# Patient Record
Sex: Female | Born: 1950 | Race: White | Hispanic: No | Marital: Married | Smoking: Never smoker
Health system: Southern US, Community
[De-identification: ages and names within clinical notes are randomized; demographics above are authoritative.]

---
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
## 1998-04-04 ENCOUNTER — Other Ambulatory Visit: Admission: RE | Admit: 1998-04-04 | Discharge: 1998-04-04 | Payer: Self-pay | Admitting: Family Medicine

## 1999-07-03 ENCOUNTER — Other Ambulatory Visit: Admission: RE | Admit: 1999-07-03 | Discharge: 1999-07-03 | Payer: Self-pay | Admitting: Family Medicine

## 2000-05-08 ENCOUNTER — Encounter: Payer: Self-pay | Admitting: Family Medicine

## 2000-05-08 ENCOUNTER — Encounter: Admission: RE | Admit: 2000-05-08 | Discharge: 2000-05-08 | Payer: Self-pay | Admitting: Family Medicine

## 2000-07-11 ENCOUNTER — Other Ambulatory Visit: Admission: RE | Admit: 2000-07-11 | Discharge: 2000-07-11 | Payer: Self-pay | Admitting: Family Medicine

## 2001-05-09 ENCOUNTER — Encounter: Payer: Self-pay | Admitting: Family Medicine

## 2001-05-09 ENCOUNTER — Encounter: Admission: RE | Admit: 2001-05-09 | Discharge: 2001-05-09 | Payer: Self-pay | Admitting: Family Medicine

## 2001-12-25 ENCOUNTER — Other Ambulatory Visit: Admission: RE | Admit: 2001-12-25 | Discharge: 2001-12-25 | Payer: Self-pay | Admitting: Family Medicine

## 2002-05-14 ENCOUNTER — Encounter: Payer: Self-pay | Admitting: Family Medicine

## 2002-05-14 ENCOUNTER — Encounter: Admission: RE | Admit: 2002-05-14 | Discharge: 2002-05-14 | Payer: Self-pay | Admitting: Family Medicine

## 2003-03-26 ENCOUNTER — Other Ambulatory Visit: Admission: RE | Admit: 2003-03-26 | Discharge: 2003-03-26 | Payer: Self-pay | Admitting: Family Medicine

## 2003-05-18 ENCOUNTER — Encounter: Admission: RE | Admit: 2003-05-18 | Discharge: 2003-05-18 | Payer: Self-pay | Admitting: Family Medicine

## 2003-05-18 ENCOUNTER — Encounter: Payer: Self-pay | Admitting: Family Medicine

## 2003-05-21 ENCOUNTER — Ambulatory Visit (HOSPITAL_COMMUNITY): Admission: RE | Admit: 2003-05-21 | Discharge: 2003-05-21 | Payer: Self-pay | Admitting: Gastroenterology

## 2004-05-19 ENCOUNTER — Ambulatory Visit (HOSPITAL_COMMUNITY): Admission: RE | Admit: 2004-05-19 | Discharge: 2004-05-19 | Payer: Self-pay | Admitting: Family Medicine

## 2004-05-26 ENCOUNTER — Other Ambulatory Visit: Admission: RE | Admit: 2004-05-26 | Discharge: 2004-05-26 | Payer: Self-pay | Admitting: Family Medicine

## 2005-06-01 ENCOUNTER — Other Ambulatory Visit: Admission: RE | Admit: 2005-06-01 | Discharge: 2005-06-01 | Payer: Self-pay | Admitting: Family Medicine

## 2005-06-18 ENCOUNTER — Ambulatory Visit (HOSPITAL_COMMUNITY): Admission: RE | Admit: 2005-06-18 | Discharge: 2005-06-18 | Payer: Self-pay | Admitting: Family Medicine

## 2006-04-17 ENCOUNTER — Encounter: Admission: RE | Admit: 2006-04-17 | Discharge: 2006-04-17 | Payer: Self-pay | Admitting: Family Medicine

## 2006-06-03 ENCOUNTER — Other Ambulatory Visit: Admission: RE | Admit: 2006-06-03 | Discharge: 2006-06-03 | Payer: Self-pay | Admitting: Family Medicine

## 2006-06-19 ENCOUNTER — Ambulatory Visit (HOSPITAL_COMMUNITY): Admission: RE | Admit: 2006-06-19 | Discharge: 2006-06-19 | Payer: Self-pay | Admitting: Family Medicine

## 2007-01-23 ENCOUNTER — Encounter: Admission: RE | Admit: 2007-01-23 | Discharge: 2007-01-23 | Payer: Self-pay | Admitting: Family Medicine

## 2007-06-18 ENCOUNTER — Other Ambulatory Visit: Admission: RE | Admit: 2007-06-18 | Discharge: 2007-06-18 | Payer: Self-pay | Admitting: Family Medicine

## 2007-06-23 ENCOUNTER — Encounter: Admission: RE | Admit: 2007-06-23 | Discharge: 2007-06-23 | Payer: Self-pay | Admitting: Family Medicine

## 2008-06-21 ENCOUNTER — Other Ambulatory Visit: Admission: RE | Admit: 2008-06-21 | Discharge: 2008-06-21 | Payer: Self-pay | Admitting: Family Medicine

## 2008-07-07 ENCOUNTER — Ambulatory Visit (HOSPITAL_COMMUNITY): Admission: RE | Admit: 2008-07-07 | Discharge: 2008-07-07 | Payer: Self-pay | Admitting: Family Medicine

## 2009-07-11 ENCOUNTER — Ambulatory Visit (HOSPITAL_COMMUNITY): Admission: RE | Admit: 2009-07-11 | Discharge: 2009-07-11 | Payer: Self-pay | Admitting: Family Medicine

## 2009-10-17 ENCOUNTER — Other Ambulatory Visit: Admission: RE | Admit: 2009-10-17 | Discharge: 2009-10-17 | Payer: Self-pay | Admitting: Family Medicine

## 2010-07-12 ENCOUNTER — Ambulatory Visit (HOSPITAL_COMMUNITY): Admission: RE | Admit: 2010-07-12 | Discharge: 2010-07-12 | Payer: Self-pay | Admitting: Family Medicine

## 2010-10-19 ENCOUNTER — Other Ambulatory Visit (HOSPITAL_COMMUNITY)
Admission: RE | Admit: 2010-10-19 | Discharge: 2010-10-19 | Disposition: A | Discharge status: Home / Self Care | Payer: 59 | Source: Ambulatory Visit | Attending: Family Medicine | Admitting: Family Medicine

## 2010-10-19 DIAGNOSIS — Z124 Encounter for screening for malignant neoplasm of cervix: Secondary | ICD-10-CM | POA: Insufficient documentation

## 2011-01-19 NOTE — Op Note (Signed)
   NAME:  Frances Crosby, Frances Crosby                          ACCOUNT NO.:  [id number]   MEDICAL RECORD NO.:  [id number]                   PATIENT TYPE:  AMB   LOCATION:  ENDO                                 FACILITY:  [facility]   PHYSICIAN:  Graylin Shiver, M.D.                DATE OF BIRTH:  1951-04-26   DATE OF PROCEDURE:  05/21/2003  DATE OF DISCHARGE:  05/21/2003                                 OPERATIVE REPORT   PROCEDURE:  Colonoscopy.   ENDOSCOPIST:  Graylin Shiver, M.D.   INDICATIONS:  Screening due to family history of colon cancer and colon  polyps.   INFORMED CONSENT:  Informed consent was obtained after explanation of the  risks of bleeding, infection and perforation.   PREMEDICATION:  Fentanyl 75 mcg IV, Versed 7 mg IV.   DESCRIPTION OF PROCEDURE:  With the patient in the left lateral decubitus  position, a rectal exam was performed and no masses were felt.  The Olympus  colonoscope was inserted into the rectum and advanced around the colon to  the cecum.  The cecal landmarks were identified.  The cecum and ascending  colon were normal.  The transverse colon was normal.  The descending colon  was normal.  A few diverticula were noted in the sigmoid colon.  The rectum  was normal.  She tolerated the procedure well without complications.   IMPRESSION:  Few diverticula noted in the sigmoid, otherwise normal  colonoscopy to the cecum.                                               Graylin Shiver, M.D.    SFG/MEDQ  D:  05/21/2003  T:  05/24/2003  Job:  [id number]   cc:   Duncan Dull, M.D.  [address]  [place]  [place] [address]  Fax: [phone]

## 2011-06-15 ENCOUNTER — Other Ambulatory Visit (HOSPITAL_COMMUNITY): Payer: Self-pay | Admitting: Family Medicine

## 2011-06-15 DIAGNOSIS — Z1231 Encounter for screening mammogram for malignant neoplasm of breast: Secondary | ICD-10-CM

## 2011-07-17 ENCOUNTER — Ambulatory Visit (HOSPITAL_COMMUNITY)
Admission: RE | Admit: 2011-07-17 | Discharge: 2011-07-17 | Disposition: A | Discharge status: Home / Self Care | Payer: 59 | Source: Ambulatory Visit | Attending: Family Medicine | Admitting: Family Medicine

## 2011-07-17 DIAGNOSIS — Z1231 Encounter for screening mammogram for malignant neoplasm of breast: Secondary | ICD-10-CM | POA: Insufficient documentation

## 2012-06-09 ENCOUNTER — Other Ambulatory Visit (HOSPITAL_COMMUNITY): Payer: Self-pay | Admitting: Family Medicine

## 2012-06-09 DIAGNOSIS — Z1231 Encounter for screening mammogram for malignant neoplasm of breast: Secondary | ICD-10-CM

## 2012-07-17 ENCOUNTER — Ambulatory Visit (HOSPITAL_COMMUNITY)
Admission: RE | Admit: 2012-07-17 | Discharge: 2012-07-17 | Disposition: A | Discharge status: Home / Self Care | Payer: 59 | Source: Ambulatory Visit | Attending: Family Medicine | Admitting: Family Medicine

## 2012-07-17 DIAGNOSIS — Z1231 Encounter for screening mammogram for malignant neoplasm of breast: Secondary | ICD-10-CM

## 2013-06-25 ENCOUNTER — Other Ambulatory Visit (HOSPITAL_COMMUNITY): Payer: Self-pay | Admitting: Family Medicine

## 2013-06-25 DIAGNOSIS — Z1231 Encounter for screening mammogram for malignant neoplasm of breast: Secondary | ICD-10-CM

## 2013-07-22 ENCOUNTER — Ambulatory Visit (HOSPITAL_COMMUNITY)
Admission: RE | Admit: 2013-07-22 | Discharge: 2013-07-22 | Disposition: A | Discharge status: Home / Self Care | Payer: 59 | Source: Ambulatory Visit | Attending: Family Medicine | Admitting: Family Medicine

## 2013-07-22 DIAGNOSIS — Z1231 Encounter for screening mammogram for malignant neoplasm of breast: Secondary | ICD-10-CM | POA: Insufficient documentation

## 2013-11-04 ENCOUNTER — Other Ambulatory Visit (HOSPITAL_COMMUNITY)
Admission: RE | Admit: 2013-11-04 | Discharge: 2013-11-04 | Disposition: A | Discharge status: Home / Self Care | Payer: 59 | Source: Ambulatory Visit | Attending: Family Medicine | Admitting: Family Medicine

## 2013-11-04 ENCOUNTER — Other Ambulatory Visit: Payer: Self-pay | Admitting: Family Medicine

## 2013-11-04 DIAGNOSIS — Z124 Encounter for screening for malignant neoplasm of cervix: Secondary | ICD-10-CM | POA: Insufficient documentation

## 2014-06-29 ENCOUNTER — Other Ambulatory Visit (HOSPITAL_COMMUNITY): Payer: Self-pay | Admitting: Family Medicine

## 2014-06-29 DIAGNOSIS — Z1231 Encounter for screening mammogram for malignant neoplasm of breast: Secondary | ICD-10-CM

## 2014-07-23 ENCOUNTER — Ambulatory Visit (HOSPITAL_COMMUNITY)
Admission: RE | Admit: 2014-07-23 | Discharge: 2014-07-23 | Disposition: A | Discharge status: Home / Self Care | Payer: 59 | Source: Ambulatory Visit | Attending: Family Medicine | Admitting: Family Medicine

## 2014-07-23 DIAGNOSIS — Z1231 Encounter for screening mammogram for malignant neoplasm of breast: Secondary | ICD-10-CM | POA: Insufficient documentation

## 2015-06-20 ENCOUNTER — Other Ambulatory Visit: Payer: Self-pay

## 2015-06-20 DIAGNOSIS — Z1231 Encounter for screening mammogram for malignant neoplasm of breast: Secondary | ICD-10-CM

## 2015-08-02 ENCOUNTER — Ambulatory Visit
Admission: RE | Admit: 2015-08-02 | Discharge: 2015-08-02 | Disposition: A | Discharge status: Home / Self Care | Payer: 59 | Source: Ambulatory Visit

## 2015-08-02 DIAGNOSIS — Z1231 Encounter for screening mammogram for malignant neoplasm of breast: Secondary | ICD-10-CM

## 2015-08-04 ENCOUNTER — Other Ambulatory Visit: Payer: Self-pay | Admitting: Family Medicine

## 2015-08-04 DIAGNOSIS — R928 Other abnormal and inconclusive findings on diagnostic imaging of breast: Secondary | ICD-10-CM

## 2015-08-10 ENCOUNTER — Ambulatory Visit
Admission: RE | Admit: 2015-08-10 | Discharge: 2015-08-10 | Disposition: A | Discharge status: Home / Self Care | Payer: 59 | Source: Ambulatory Visit | Attending: Family Medicine | Admitting: Family Medicine

## 2015-08-10 DIAGNOSIS — R928 Other abnormal and inconclusive findings on diagnostic imaging of breast: Secondary | ICD-10-CM

## 2016-01-02 ENCOUNTER — Other Ambulatory Visit: Payer: Self-pay | Admitting: Family Medicine

## 2016-01-02 DIAGNOSIS — N63 Unspecified lump in unspecified breast: Secondary | ICD-10-CM

## 2016-02-09 ENCOUNTER — Ambulatory Visit
Admission: RE | Admit: 2016-02-09 | Discharge: 2016-02-09 | Disposition: A | Discharge status: Home / Self Care | Payer: 59 | Source: Ambulatory Visit | Attending: Family Medicine | Admitting: Family Medicine

## 2016-02-09 DIAGNOSIS — N63 Unspecified lump in unspecified breast: Secondary | ICD-10-CM

## 2016-06-18 ENCOUNTER — Other Ambulatory Visit: Payer: Self-pay | Admitting: Family Medicine

## 2016-06-18 DIAGNOSIS — N6320 Unspecified lump in the left breast, unspecified quadrant: Secondary | ICD-10-CM

## 2016-08-02 ENCOUNTER — Ambulatory Visit
Admission: RE | Admit: 2016-08-02 | Discharge: 2016-08-02 | Disposition: A | Discharge status: Home / Self Care | Payer: 59 | Source: Ambulatory Visit | Attending: Family Medicine | Admitting: Family Medicine

## 2016-08-02 DIAGNOSIS — N6320 Unspecified lump in the left breast, unspecified quadrant: Secondary | ICD-10-CM

## 2016-12-10 ENCOUNTER — Other Ambulatory Visit (HOSPITAL_COMMUNITY)
Admission: RE | Admit: 2016-12-10 | Discharge: 2016-12-10 | Disposition: A | Discharge status: Home / Self Care | Payer: 59 | Source: Ambulatory Visit | Attending: Family Medicine | Admitting: Family Medicine

## 2016-12-10 ENCOUNTER — Other Ambulatory Visit: Payer: Self-pay | Admitting: Family Medicine

## 2016-12-10 DIAGNOSIS — Z01411 Encounter for gynecological examination (general) (routine) with abnormal findings: Secondary | ICD-10-CM | POA: Insufficient documentation

## 2016-12-12 LAB — CYTOLOGY - PAP: DIAGNOSIS: NEGATIVE

## 2017-06-21 ENCOUNTER — Other Ambulatory Visit: Payer: Self-pay | Admitting: Family Medicine

## 2017-06-21 DIAGNOSIS — N6320 Unspecified lump in the left breast, unspecified quadrant: Secondary | ICD-10-CM

## 2017-08-05 ENCOUNTER — Other Ambulatory Visit: Payer: Self-pay | Admitting: Family Medicine

## 2017-08-05 ENCOUNTER — Ambulatory Visit
Admission: RE | Admit: 2017-08-05 | Discharge: 2017-08-05 | Disposition: A | Discharge status: Home / Self Care | Payer: Medicare Other | Source: Ambulatory Visit | Attending: Family Medicine | Admitting: Family Medicine

## 2017-08-05 ENCOUNTER — Ambulatory Visit
Admission: RE | Admit: 2017-08-05 | Discharge: 2017-08-05 | Disposition: A | Discharge status: Home / Self Care | Payer: 59 | Source: Ambulatory Visit | Attending: Family Medicine | Admitting: Family Medicine

## 2017-08-05 DIAGNOSIS — N6320 Unspecified lump in the left breast, unspecified quadrant: Secondary | ICD-10-CM

## 2017-08-07 ENCOUNTER — Ambulatory Visit
Admission: RE | Admit: 2017-08-07 | Discharge: 2017-08-07 | Disposition: A | Discharge status: Home / Self Care | Payer: Medicare Other | Source: Ambulatory Visit | Attending: Family Medicine | Admitting: Family Medicine

## 2017-08-07 ENCOUNTER — Other Ambulatory Visit: Payer: Self-pay | Admitting: Family Medicine

## 2017-08-07 DIAGNOSIS — N6320 Unspecified lump in the left breast, unspecified quadrant: Secondary | ICD-10-CM

## 2018-07-07 ENCOUNTER — Other Ambulatory Visit: Payer: Self-pay | Admitting: Family Medicine

## 2018-07-07 DIAGNOSIS — Z1231 Encounter for screening mammogram for malignant neoplasm of breast: Secondary | ICD-10-CM

## 2018-08-14 ENCOUNTER — Ambulatory Visit
Admission: RE | Admit: 2018-08-14 | Discharge: 2018-08-14 | Disposition: A | Discharge status: Home / Self Care | Payer: Medicare Other | Source: Ambulatory Visit | Attending: Family Medicine | Admitting: Family Medicine

## 2018-08-14 DIAGNOSIS — Z1231 Encounter for screening mammogram for malignant neoplasm of breast: Secondary | ICD-10-CM

## 2019-07-01 ENCOUNTER — Other Ambulatory Visit: Payer: Self-pay | Admitting: Family Medicine

## 2019-07-01 DIAGNOSIS — Z1231 Encounter for screening mammogram for malignant neoplasm of breast: Secondary | ICD-10-CM

## 2019-08-20 ENCOUNTER — Other Ambulatory Visit: Payer: Self-pay

## 2019-08-20 ENCOUNTER — Ambulatory Visit
Admission: RE | Admit: 2019-08-20 | Discharge: 2019-08-20 | Disposition: A | Discharge status: Home / Self Care | Payer: Medicare Other | Source: Ambulatory Visit | Attending: Family Medicine | Admitting: Family Medicine

## 2019-08-20 DIAGNOSIS — Z1231 Encounter for screening mammogram for malignant neoplasm of breast: Secondary | ICD-10-CM

## 2019-10-11 ENCOUNTER — Ambulatory Visit: Payer: Self-pay

## 2019-10-29 ENCOUNTER — Ambulatory Visit: Payer: Medicare Other

## 2019-12-28 ENCOUNTER — Other Ambulatory Visit: Payer: Self-pay | Admitting: Family Medicine

## 2019-12-28 DIAGNOSIS — E2839 Other primary ovarian failure: Secondary | ICD-10-CM

## 2020-03-18 ENCOUNTER — Inpatient Hospital Stay: Admission: RE | Admit: 2020-03-18 | Payer: Medicare Other | Source: Ambulatory Visit

## 2020-03-23 ENCOUNTER — Ambulatory Visit
Admission: RE | Admit: 2020-03-23 | Discharge: 2020-03-23 | Disposition: A | Discharge status: Home / Self Care | Payer: Medicare Other | Source: Ambulatory Visit | Attending: Family Medicine | Admitting: Family Medicine

## 2020-03-23 ENCOUNTER — Other Ambulatory Visit: Payer: Self-pay

## 2020-03-23 DIAGNOSIS — E2839 Other primary ovarian failure: Secondary | ICD-10-CM

## 2020-07-06 ENCOUNTER — Other Ambulatory Visit: Payer: Self-pay | Admitting: Family Medicine

## 2020-07-06 DIAGNOSIS — Z1231 Encounter for screening mammogram for malignant neoplasm of breast: Secondary | ICD-10-CM

## 2020-08-22 ENCOUNTER — Other Ambulatory Visit: Payer: Self-pay

## 2020-08-22 ENCOUNTER — Ambulatory Visit
Admission: RE | Admit: 2020-08-22 | Discharge: 2020-08-22 | Disposition: A | Discharge status: Home / Self Care | Payer: Medicare Other | Source: Ambulatory Visit | Attending: Family Medicine | Admitting: Family Medicine

## 2020-08-22 DIAGNOSIS — Z1231 Encounter for screening mammogram for malignant neoplasm of breast: Secondary | ICD-10-CM

## 2021-01-16 ENCOUNTER — Other Ambulatory Visit (HOSPITAL_COMMUNITY): Payer: Self-pay | Admitting: Family Medicine

## 2021-01-17 ENCOUNTER — Other Ambulatory Visit: Payer: Self-pay

## 2021-01-17 ENCOUNTER — Ambulatory Visit (HOSPITAL_BASED_OUTPATIENT_CLINIC_OR_DEPARTMENT_OTHER)
Admission: RE | Admit: 2021-01-17 | Discharge: 2021-01-17 | Disposition: A | Discharge status: Home / Self Care | Payer: Self-pay | Source: Ambulatory Visit | Attending: Family Medicine | Admitting: Family Medicine

## 2021-01-17 DIAGNOSIS — E7800 Pure hypercholesterolemia, unspecified: Secondary | ICD-10-CM | POA: Insufficient documentation

## 2021-02-07 ENCOUNTER — Other Ambulatory Visit: Payer: Self-pay

## 2021-02-07 DIAGNOSIS — R931 Abnormal findings on diagnostic imaging of heart and coronary circulation: Secondary | ICD-10-CM

## 2021-02-07 DIAGNOSIS — E559 Vitamin D deficiency, unspecified: Secondary | ICD-10-CM

## 2021-02-07 DIAGNOSIS — I2510 Atherosclerotic heart disease of native coronary artery without angina pectoris: Secondary | ICD-10-CM

## 2021-02-07 DIAGNOSIS — I10 Essential (primary) hypertension: Secondary | ICD-10-CM | POA: Insufficient documentation

## 2021-02-07 DIAGNOSIS — J301 Allergic rhinitis due to pollen: Secondary | ICD-10-CM | POA: Insufficient documentation

## 2021-02-07 DIAGNOSIS — Z86010 Personal history of colonic polyps: Secondary | ICD-10-CM

## 2021-02-07 DIAGNOSIS — E2839 Other primary ovarian failure: Secondary | ICD-10-CM | POA: Insufficient documentation

## 2021-02-07 DIAGNOSIS — E7800 Pure hypercholesterolemia, unspecified: Secondary | ICD-10-CM

## 2021-02-07 DIAGNOSIS — I2584 Coronary atherosclerosis due to calcified coronary lesion: Secondary | ICD-10-CM | POA: Insufficient documentation

## 2021-02-07 DIAGNOSIS — Z860101 Personal history of adenomatous and serrated colon polyps: Secondary | ICD-10-CM

## 2021-02-07 HISTORY — DX: Personal history of colonic polyps: Z86.010

## 2021-02-07 HISTORY — DX: Vitamin D deficiency, unspecified: E55.9

## 2021-02-07 HISTORY — DX: Personal history of adenomatous and serrated colon polyps: Z86.0101

## 2021-02-07 HISTORY — DX: Pure hypercholesterolemia, unspecified: E78.00

## 2021-02-07 HISTORY — DX: Allergic rhinitis due to pollen: J30.1

## 2021-02-07 HISTORY — DX: Atherosclerotic heart disease of native coronary artery without angina pectoris: I25.10

## 2021-02-07 HISTORY — DX: Essential (primary) hypertension: I10

## 2021-02-07 HISTORY — DX: Abnormal findings on diagnostic imaging of heart and coronary circulation: R93.1

## 2021-02-14 ENCOUNTER — Ambulatory Visit: Payer: Medicare Other | Admitting: Cardiology

## 2021-02-14 ENCOUNTER — Other Ambulatory Visit: Payer: Self-pay

## 2021-02-14 ENCOUNTER — Encounter: Payer: Self-pay | Admitting: Cardiology

## 2021-02-14 ENCOUNTER — Other Ambulatory Visit: Payer: Self-pay | Admitting: Cardiology

## 2021-02-14 VITALS — BP 110/60 | HR 58 | Ht 66.0 in | Wt 155.0 lb

## 2021-02-14 DIAGNOSIS — E7800 Pure hypercholesterolemia, unspecified: Secondary | ICD-10-CM | POA: Diagnosis not present

## 2021-02-14 DIAGNOSIS — I10 Essential (primary) hypertension: Secondary | ICD-10-CM | POA: Diagnosis not present

## 2021-02-14 DIAGNOSIS — I2510 Atherosclerotic heart disease of native coronary artery without angina pectoris: Secondary | ICD-10-CM | POA: Diagnosis not present

## 2021-02-14 NOTE — Patient Instructions (Signed)

## 2021-02-14 NOTE — Progress Notes (Signed)
Cardiology Consultation:    Date:  02/14/2021   ID:  Frances Crosby, DOB 03-25-1951, MRN [id number]  PCP:  Frances Hale, MD  Cardiologist:  Frances Balsam, MD   Referring MD: Frances [place], MD   No chief complaint on file. I have abnormal CT of my hear  History of Present Illness:    Frances Crosby is a 70 y.o. female who is being seen today for the evaluation of elevated calcium score at the request of Frances [place], MD. past medical history significant for essential hypertension, she is being on phentermine as well as hydrochlorothiazide for years, also dyslipidemia.  Recently she had annual physical done by her primary care physician decision has been made to pursue coronary CT calcium score which was done, total #217 which put her at 47 percentile for age and race and sex matched controls.  She is here to talk about it. Overall she is very active she walks almost every single day for long time with her friend and she got no difficulty doing it independent functions been doing this for more than 10 years she did not notice any deterioration in her ability to do that denies having any chest pain tightness squeezing pressure burning chest.  She does get short of breath while walking uphill but this is nothing new and actually she tells me this is getting better.  There is no cardiac complaints whatsoever.  There is no palpitations no dizziness no swelling of lower extremities overall she is doing very well.  She is also very health-conscious and diet conscious.  She is trying to avoid red meat and we did actually discuss basic of Mediterranean diet which she was already very much aware of.  She never smoked.  She did have a husband who got significant heart problem and Dr. Hurman Horn was taking care of him.  And sadly he passed few months ago.  She is planning to go to a trip to [place] actually she is having car of an trip with multiple RVs going there.  She is looking forward  to it.  Since she was diagnosed with having elevated calcium score and coronary arteries she started taking rosuvastatin so far doing quite well with this described to have some cramps. She does have family history of premature coronary artery disease  Past Medical History:  Diagnosis Date   Abnormal findings on diagnostic imaging of heart and coronary circulation 02/07/2021   Allergic rhinitis due to pollen 02/07/2021   Atherosclerosis of coronary artery 02/07/2021   Essential hypertension 02/07/2021   History of adenomatous polyp of colon 02/07/2021   Pure hypercholesterolemia 02/07/2021   Vitamin D deficiency 02/07/2021    Past Surgical History:  Procedure Laterality Date   CYSTECTOMY  1971    Current Medications: Current Meds  Medication Sig   atenolol (TENORMIN) 25 MG tablet Take 1 tablet by mouth 2 (two) times daily.   Calcium Citrate-Vitamin D3 315-250 MG-UNIT TABS Take 1 tablet by mouth daily.   cholecalciferol (VITAMIN D3) 25 MCG (1000 UNIT) tablet Take 1,000 Units by mouth daily.   fluticasone (FLONASE) 50 MCG/ACT nasal spray Place 1 spray into both nostrils daily.   hydrochlorothiazide (MICROZIDE) 12.5 MG capsule Take 1 tablet by mouth daily.   loratadine (CLARITIN) 10 MG tablet Take 1 tablet by mouth as needed for allergies.   meclizine (ANTIVERT) 25 MG tablet Take 25 mg by mouth as needed for dizziness.   melatonin 3 MG TABS tablet Take 1  tablet by mouth daily.   Misc Natural Products (GLUCOS-CHONDROIT-MSM COMPLEX) TABS Take 2 tablets by mouth daily.   Multiple Vitamin (MULTI-VITAMIN DAILY PO) Take 1 tablet by mouth daily.   naproxen sodium (ALEVE) 220 MG tablet Take 1 tablet by mouth as needed (pain).   rosuvastatin (CRESTOR) 10 MG tablet Take 1 tablet by mouth daily.     Allergies:   Patient has no known allergies.   Social History   Socioeconomic History   Marital status: Married    Spouse name: Not on file   Number of children: Not on file   Years of education: Not on  file   Highest education level: Not on file  Occupational History   Not on file  Tobacco Use   Smoking status: Never   Smokeless tobacco: Never  Substance and Sexual Activity   Alcohol use: Not on file    Comment: 1-3 drinks per month   Drug use: Never   Sexual activity: Not on file  Other Topics Concern   Not on file  Social History Narrative   Not on file   Social Determinants of Health   Financial Resource Strain: Not on file  Food Insecurity: Not on file  Transportation Needs: Not on file  Physical Activity: Not on file  Stress: Not on file  Social Connections: Not on file     Family History: The patient's family history includes Atrial fibrillation in her brother and father; Cancer - Colon in her paternal grandfather; Diabetes in her father; Kidney cancer in her father; Parkinson's disease in her mother; Vascular Disease in her maternal grandfather. ROS:   Please see the history of present illness.    All 14 point review of systems negative except as described per history of present illness.  EKGs/Labs/Other Studies Reviewed:    The following studies were reviewed today: Coronary calcium score 217 which is a 86 percentile of ag race and sex matched controls.  EKG:  EKG is  ordered today.  The ekg ordered today demonstrates EKG showed sinus bradycardia, low voltage QRS, nonspecific ST segment changes  Recent Labs: No results found for requested labs within last 8760 hours.  Recent Lipid Panel No results found for: CHOL, TRIG, HDL, CHOLHDL, VLDL, LDLCALC, LDLDIRECT  Physical Exam:    VS:  BP 110/60 (BP Location: Left Arm, Patient Position: Sitting, Cuff Size: Normal)   Pulse (!) 58   Ht 5\' 6"  (1.676 m)   Wt 155 lb (70.3 kg)   LMP 07/17/2011   SpO2 99%   BMI 25.02 kg/m     Wt Readings from Last 3 Encounters:  02/14/21 155 lb (70.3 kg)     GEN:  Well nourished, well developed in no acute distress HEENT: Normal NECK: No JVD; No carotid bruits LYMPHATICS:  No lymphadenopathy CARDIAC: RRR, no murmurs, no rubs, no gallops RESPIRATORY:  Clear to auscultation without rales, wheezing or rhonchi  ABDOMEN: Soft, non-tender, non-distended MUSCULOSKELETAL:  No edema; No deformity  SKIN: Warm and dry NEUROLOGIC:  Alert and oriented x 3 PSYCHIATRIC:  Normal affect   ASSESSMENT:    1. Atherosclerosis of coronary artery, unspecified vessel or lesion type, unspecified whether angina present, unspecified whether native or transplanted heart   2. Essential hypertension   3. Pure hypercholesterolemia    PLAN:    In order of problems listed above:  Atherosclerosis of coronary arteries based on her calcium score which is 217.  With a long discussion I explained to her what that means.  Likely she is absolutely completely asymptomatic therefore I do not think we need to do any testing trying to see if she got obstructive disease.  Yes of course we need to modify risk factors for coronary artery disease and she is already initiated statin which is very appropriate move.  We discussed the need to exercise on the regular basis which she already does, we discussed basic of Mediterranean diet which she was aware of and I strongly encouraged her to pursue that.  We did did talk about potentially having echocardiogram done to assess left ventricle ejection fraction, however she is completely asymptomatic doing exercises on the regular basis I do not have any heart push for doing this test right now.  On top of that she is going on the trip to [place] for next few months.  What we decided to do is to perform direct LDL today to see if rosuvastatin 10 mg is sufficient. Essential hypertension blood pressure seems to be good control today.  We will continue present management.  In the future we may switch her from atenolol to some different medication. Dyslipidemia she started rosuvastatin and plan is as described above.  Overall after admit she is in the very good shape  taking care of herself well there is not much I had to add to what she is doing already.  I need to see her back in about a year to see how she does   Medication Adjustments/Labs and Tests Ordered: Current medicines are reviewed at length with the patient today.  Concerns regarding medicines are outlined above.  Orders Placed This Encounter  Procedures   Direct LDL   EKG 12-Lead   No orders of the defined types were placed in this encounter.   Signed, Georgeanna Lea, MD, [facility]. 02/14/2021 12:02 PM    [facility]

## 2021-02-15 LAB — LDL CHOLESTEROL, DIRECT: LDL Direct: 66 mg/dL (ref 0–99)

## 2021-02-20 ENCOUNTER — Telehealth: Payer: Self-pay | Admitting: Cardiology

## 2021-02-20 NOTE — Telephone Encounter (Signed)
Patient informed of results.  

## 2021-02-20 NOTE — Telephone Encounter (Signed)
Follow up:     Patient returning nurse call back for results.

## 2021-05-09 ENCOUNTER — Ambulatory Visit: Payer: Medicare Other | Admitting: Physician Assistant

## 2021-07-10 ENCOUNTER — Other Ambulatory Visit: Payer: Self-pay | Admitting: Family Medicine

## 2021-07-10 DIAGNOSIS — Z1231 Encounter for screening mammogram for malignant neoplasm of breast: Secondary | ICD-10-CM

## 2021-08-22 ENCOUNTER — Other Ambulatory Visit: Payer: Self-pay | Admitting: Family Medicine

## 2021-08-22 ENCOUNTER — Ambulatory Visit
Admission: RE | Admit: 2021-08-22 | Discharge: 2021-08-22 | Disposition: A | Discharge status: Home / Self Care | Payer: Medicare Other | Source: Ambulatory Visit | Attending: Family Medicine | Admitting: Family Medicine

## 2021-08-22 DIAGNOSIS — Z1231 Encounter for screening mammogram for malignant neoplasm of breast: Secondary | ICD-10-CM

## 2021-08-23 ENCOUNTER — Ambulatory Visit
Admission: RE | Admit: 2021-08-23 | Discharge: 2021-08-23 | Disposition: A | Discharge status: Home / Self Care | Payer: Medicare Other | Source: Ambulatory Visit | Attending: Family Medicine | Admitting: Family Medicine

## 2021-12-04 ENCOUNTER — Telehealth: Payer: Self-pay | Admitting: Cardiology

## 2021-12-04 NOTE — Telephone Encounter (Signed)
Patient would like to switch to a provider in our [place] location. Patient would like to see Dr. Anne Fu. Please advise ?

## 2022-01-18 ENCOUNTER — Other Ambulatory Visit: Payer: Self-pay | Admitting: Gastroenterology

## 2022-01-18 ENCOUNTER — Ambulatory Visit
Admission: RE | Admit: 2022-01-18 | Discharge: 2022-01-18 | Disposition: A | Discharge status: Home / Self Care | Payer: Medicare Other | Source: Ambulatory Visit | Attending: Gastroenterology | Admitting: Gastroenterology

## 2022-01-18 DIAGNOSIS — R195 Other fecal abnormalities: Secondary | ICD-10-CM

## 2022-01-22 ENCOUNTER — Other Ambulatory Visit: Payer: Self-pay | Admitting: Gastroenterology

## 2022-01-22 DIAGNOSIS — K639 Disease of intestine, unspecified: Secondary | ICD-10-CM

## 2022-01-22 DIAGNOSIS — R935 Abnormal findings on diagnostic imaging of other abdominal regions, including retroperitoneum: Secondary | ICD-10-CM

## 2022-01-24 ENCOUNTER — Ambulatory Visit
Admission: RE | Admit: 2022-01-24 | Discharge: 2022-01-24 | Disposition: A | Discharge status: Home / Self Care | Payer: Medicare Other | Source: Ambulatory Visit | Attending: Gastroenterology | Admitting: Gastroenterology

## 2022-01-24 DIAGNOSIS — K639 Disease of intestine, unspecified: Secondary | ICD-10-CM

## 2022-01-24 DIAGNOSIS — R935 Abnormal findings on diagnostic imaging of other abdominal regions, including retroperitoneum: Secondary | ICD-10-CM

## 2022-01-24 MED ORDER — IOPAMIDOL (ISOVUE-300) INJECTION 61%
100.0000 mL | Freq: Once | INTRAVENOUS | Status: AC | PRN
  Administered 2022-01-24: 100 mL via INTRAVENOUS

## 2022-03-14 ENCOUNTER — Encounter: Payer: Self-pay | Admitting: Cardiology

## 2022-03-14 ENCOUNTER — Ambulatory Visit: Payer: Medicare Other | Admitting: Cardiology

## 2022-03-14 DIAGNOSIS — I2510 Atherosclerotic heart disease of native coronary artery without angina pectoris: Secondary | ICD-10-CM | POA: Diagnosis not present

## 2022-03-14 DIAGNOSIS — I2584 Coronary atherosclerosis due to calcified coronary lesion: Secondary | ICD-10-CM

## 2022-03-14 DIAGNOSIS — I10 Essential (primary) hypertension: Secondary | ICD-10-CM

## 2022-03-14 DIAGNOSIS — E7800 Pure hypercholesterolemia, unspecified: Secondary | ICD-10-CM

## 2022-03-14 MED ORDER — ASPIRIN 81 MG PO TBEC: 81.0000 mg | DELAYED_RELEASE_TABLET | Freq: Every day | ORAL | 3 refills | Status: AC

## 2022-03-14 NOTE — Assessment & Plan Note (Addendum)
Currently continuing with atenolol 25 mg once a day as well as HCTZ 12.5 mg daily as prescribed by her primary care physician Dr. Chanetta Marshall.  Continuing with current prescription drug management with no changes.  Previously had decreased her atenolol to once a day.  Her blood pressure is still remaining under good control.

## 2022-03-14 NOTE — Progress Notes (Signed)
Cardiology Office Note:    Date:  03/14/2022   ID:  Frances Crosby, DOB [birth year], MRN [id number]  PCP:  No primary care provider on file.   [facility] Providers Cardiologist:  Donato Schultz, MD     Referring MD: Shon Hale, *    History of Present Illness:    Frances Crosby is a 71 y.o. female here for follow-up of elevated coronary calcium score, 217, 86 percentile.  Active, walks daily.  No chest pain.  Occasional shortness of breath when walking uphill.  Nothing new.  No palpitations.    On Crestor 10 mg a day.  LDL was 66 in June 2022.  Excellent.  Her brother and father had atrial fibrillation.  Went on a trip to [place] Past Medical History:  Diagnosis Date   Abnormal findings on diagnostic imaging of heart and coronary circulation 02/07/2021   Allergic rhinitis due to pollen 02/07/2021   Atherosclerosis of coronary artery 02/07/2021   Essential hypertension 02/07/2021   History of adenomatous polyp of colon 02/07/2021   Pure hypercholesterolemia 02/07/2021   Vitamin D deficiency 02/07/2021    Past Surgical History:  Procedure Laterality Date   CYSTECTOMY  1971    Current Medications: Current Meds  Medication Sig   aspirin EC 81 MG tablet Take 1 tablet (81 mg total) by mouth daily. Swallow whole.   atenolol (TENORMIN) 25 MG tablet Take 1 tablet by mouth daily.   Calcium Citrate-Vitamin D3 315-250 MG-UNIT TABS Take 1 tablet by mouth daily.   cholecalciferol (VITAMIN D3) 25 MCG (1000 UNIT) tablet Take 1,000 Units by mouth daily.   hydrochlorothiazide (MICROZIDE) 12.5 MG capsule Take 1 tablet by mouth daily.   Misc Natural Products (GLUCOS-CHONDROIT-MSM COMPLEX) TABS Take 2 tablets by mouth daily.   Multiple Vitamin (MULTI-VITAMIN DAILY PO) Take 1 tablet by mouth daily.   rosuvastatin (CRESTOR) 10 MG tablet Take 1 tablet by mouth daily.   [DISCONTINUED] melatonin 3 MG TABS tablet Take 1 tablet by mouth daily.     Allergies:   Patient has no known allergies.    Social History   Socioeconomic History   Marital status: Married    Spouse name: Not on file   Number of children: Not on file   Years of education: Not on file   Highest education level: Not on file  Occupational History   Not on file  Tobacco Use   Smoking status: Never   Smokeless tobacco: Never  Substance and Sexual Activity   Alcohol use: Not on file    Comment: 1-3 drinks per month   Drug use: Never   Sexual activity: Not on file  Other Topics Concern   Not on file  Social History Narrative   Not on file   Social Determinants of Health   Financial Resource Strain: Not on file  Food Insecurity: Not on file  Transportation Needs: Not on file  Physical Activity: Not on file  Stress: Not on file  Social Connections: Not on file     Family History: The patient's family history includes Atrial fibrillation in her brother and father; Cancer - Colon in her paternal grandfather; Diabetes in her father; Kidney cancer in her father; Parkinson's disease in her mother; Vascular Disease in her maternal grandfather.  ROS:   Please see the history of present illness.     All other systems reviewed and are negative.  EKGs/Labs/Other Studies Reviewed:     EKG:  EKG is  ordered today.  The ekg ordered today demonstrates sinus rhythm 71 no other abnormalities. Prior EKG shows sinus bradycardia 58 with no other abnormalities. Recent Labs: No results found for requested labs within last 365 days.  Recent Lipid Panel    Component Value Date/Time   LDLDIRECT 66 02/14/2021 1047     Risk Assessment/Calculations:              Physical Exam:    VS:  BP 122/68   Pulse 71   Ht 5\' 6"  (1.676 m)   Wt 162 lb 6.4 oz (73.7 kg)   LMP 07/17/2011   SpO2 100%   BMI 26.21 kg/m     Wt Readings from Last 3 Encounters:  03/14/22 162 lb 6.4 oz (73.7 kg)  02/14/21 155 lb (70.3 kg)     GEN:  Well nourished, well developed in no acute distress HEENT: Normal NECK: No JVD; No  carotid bruits LYMPHATICS: No lymphadenopathy CARDIAC: RRR, no murmurs, no rubs, gallops RESPIRATORY:  Clear to auscultation without rales, wheezing or rhonchi  ABDOMEN: Soft, non-tender, non-distended MUSCULOSKELETAL:  No edema; No deformity  SKIN: Warm and dry NEUROLOGIC:  Alert and oriented x 3 PSYCHIATRIC:  Normal affect   ASSESSMENT:    1. Calcification of coronary artery   2. Pure hypercholesterolemia   3. Essential hypertension    PLAN:    In order of problems listed above:  Calcification of coronary artery Coronary calcium score 01/2021: Coronary calcium score of 217. This was 86th percentile.  Continue with Crestor 10 mg a day.  Doing very well.  No myalgias.  Also start aspirin 81 mg.  Watch for any signs of bleeding.  Discussed rationale.  Diet, exercise, Mediterranean type diet.  She is continuing to enjoy her RV trips.  Went to [place] previously.  Going to [place] and [place] next.  Pure hypercholesterolemia LDL 66 in 2022.  Excellent.  On Crestor 10 mg a day.  Continue.  No myalgias.  Continue with Mediterranean diet etc.  Essential hypertension Currently continuing with atenolol 25 mg once a day as well as HCTZ 12.5 mg daily as prescribed by her primary care physician Dr. Lindell Noe.  Continuing with current prescription drug management with no changes.  Previously had decreased her atenolol to once a day.  Her blood pressure is still remaining under good control.         Medication Adjustments/Labs and Tests Ordered: Current medicines are reviewed at length with the patient today.  Concerns regarding medicines are outlined above.  Orders Placed This Encounter  Procedures   EKG 12-Lead   Meds ordered this encounter  Medications   aspirin EC 81 MG tablet    Sig: Take 1 tablet (81 mg total) by mouth daily. Swallow whole.    Dispense:  90 tablet    Refill:  3    Patient Instructions  Medication Instructions:  Your physician has recommended you make the  following change in your medication:  Start taking Aspirin 81mg  daily  *If you need a refill on your cardiac medications before your next appointment, please call your pharmacy*   Follow-Up: At [facility], you and your health needs are our priority.  As part of our continuing mission to provide you with exceptional heart care, we have created designated Provider Care Teams.  These Care Teams include your primary Cardiologist (physician) and Advanced Practice Providers (APPs -  Physician Assistants and Nurse Practitioners) who all work together to provide you with the care you need, when you need  it.  We recommend signing up for the patient portal called "MyChart".  Sign up information is provided on this After Visit Summary.  MyChart is used to connect with patients for Virtual Visits (Telemedicine).  Patients are able to view lab/test results, encounter notes, upcoming appointments, etc.  Non-urgent messages can be sent to your provider as well.   To learn more about what you can do with MyChart, go to [email or web address].    Your next appointment:   1 year(s)  The format for your next appointment:   In Person  Provider:   None {  ant Information About Sugar         Signed, Donato Schultz, MD  03/14/2022 9:35 AM    [facility]

## 2022-03-14 NOTE — Patient Instructions (Signed)
Medication Instructions:  Your physician has recommended you make the following change in your medication:  Start taking Aspirin 81mg  daily  *If you need a refill on your cardiac medications before your next appointment, please call your pharmacy*   Follow-Up: At [facility], you and your health needs are our priority.  As part of our continuing mission to provide you with exceptional heart care, we have created designated Provider Care Teams.  These Care Teams include your primary Cardiologist (physician) and Advanced Practice Providers (APPs -  Physician Assistants and Nurse Practitioners) who all work together to provide you with the care you need, when you need it.  We recommend signing up for the patient portal called "MyChart".  Sign up information is provided on this After Visit Summary.  MyChart is used to connect with patients for Virtual Visits (Telemedicine).  Patients are able to view lab/test results, encounter notes, upcoming appointments, etc.  Non-urgent messages can be sent to your provider as well.   To learn more about what you can do with MyChart, go to [facility].    Your next appointment:   1 year(s)  The format for your next appointment:   In Person  Provider:   None {  ant Information About Sugar

## 2022-03-14 NOTE — Assessment & Plan Note (Addendum)
Coronary calcium score 01/2021: Coronary calcium score of 217. This was 86th percentile.  Continue with Crestor 10 mg a day.  Doing very well.  No myalgias.  Also start aspirin 81 mg.  Watch for any signs of bleeding.  Discussed rationale.  Diet, exercise, Mediterranean type diet.  She is continuing to enjoy her RV trips.  Went to [place] previously.  Going to [place] and [place] next.

## 2022-03-14 NOTE — Assessment & Plan Note (Signed)
LDL 66 in 2022.  Excellent.  On Crestor 10 mg a day.  Continue.  No myalgias.  Continue with Mediterranean diet etc.

## 2022-07-10 ENCOUNTER — Other Ambulatory Visit: Payer: Self-pay | Admitting: Family Medicine

## 2022-07-10 DIAGNOSIS — Z1231 Encounter for screening mammogram for malignant neoplasm of breast: Secondary | ICD-10-CM

## 2022-09-11 ENCOUNTER — Ambulatory Visit
Admission: RE | Admit: 2022-09-11 | Discharge: 2022-09-11 | Disposition: A | Discharge status: Home / Self Care | Payer: Medicare Other | Source: Ambulatory Visit | Attending: Family Medicine | Admitting: Family Medicine

## 2022-09-11 DIAGNOSIS — Z1231 Encounter for screening mammogram for malignant neoplasm of breast: Secondary | ICD-10-CM

## 2022-11-05 ENCOUNTER — Ambulatory Visit (HOSPITAL_BASED_OUTPATIENT_CLINIC_OR_DEPARTMENT_OTHER)
Admission: RE | Admit: 2022-11-05 | Discharge: 2022-11-05 | Disposition: A | Discharge status: Home / Self Care | Payer: Medicare Other | Source: Ambulatory Visit | Attending: Family Medicine | Admitting: Family Medicine

## 2022-11-05 ENCOUNTER — Other Ambulatory Visit (HOSPITAL_BASED_OUTPATIENT_CLINIC_OR_DEPARTMENT_OTHER): Payer: Self-pay | Admitting: Family Medicine

## 2022-11-05 DIAGNOSIS — R2242 Localized swelling, mass and lump, left lower limb: Secondary | ICD-10-CM | POA: Diagnosis present

## 2022-11-05 DIAGNOSIS — M7989 Other specified soft tissue disorders: Secondary | ICD-10-CM | POA: Insufficient documentation

## 2023-06-26 ENCOUNTER — Encounter (INDEPENDENT_AMBULATORY_CARE_PROVIDER_SITE_OTHER): Payer: Self-pay | Admitting: Otolaryngology

## 2023-07-08 ENCOUNTER — Encounter: Payer: Self-pay | Admitting: Cardiology

## 2023-07-08 ENCOUNTER — Ambulatory Visit: Payer: Medicare Other | Attending: Cardiology | Admitting: Cardiology

## 2023-07-08 VITALS — BP 118/64 | HR 87 | Ht 66.0 in | Wt 164.0 lb

## 2023-07-08 DIAGNOSIS — Z09 Encounter for follow-up examination after completed treatment for conditions other than malignant neoplasm: Secondary | ICD-10-CM

## 2023-07-08 DIAGNOSIS — I10 Essential (primary) hypertension: Secondary | ICD-10-CM | POA: Diagnosis not present

## 2023-07-08 DIAGNOSIS — I2510 Atherosclerotic heart disease of native coronary artery without angina pectoris: Secondary | ICD-10-CM | POA: Diagnosis not present

## 2023-07-08 NOTE — Progress Notes (Signed)
Cardiology Office Note:  .   Date:  07/08/2023  ID:  Frances Crosby, DOB 1951/03/01, MRN [id number] PCP: Frances Hale, MD  [facility] Providers Cardiologist:  Donato Schultz, MD     History of Present Illness: .   Frances Crosby is a 72 y.o. female Discussed with the use of AI scribe software  History of Present Illness   The patient, a 72 year old with a history of coronary artery disease, presented for a follow-up visit. Her coronary calcium score was 217 (86th percentile) in May 2022. She has been on a regimen of Crestor 10mg  daily, which was initiated after the calcium score results. The patient also has a family history of atrial fibrillation in a sibling. She has been adhering to a Mediterranean diet and has been active, enjoying several trips, including an RV caravan around the [facility].  The patient reported a few episodes of sudden energy loss over the past few years, with the most memorable incident occurring in an antique store. These episodes were characterized by a sudden onset of fatigue, leading to the need for rest and sleep. These episodes have not recurred since a change in her medication from Atenolol to Losartan, although it is unclear if the medication change is directly related to the cessation of these episodes.  The patient also reported a recent history of nosebleeds, which seemed to occur most often when she was bending over. However, these nosebleeds have ceased since she made an appointment with an ENT specialist. She also has a history of vertigo, but it has been several years since the last episode. The patient's cholesterol levels have been well-controlled with Crestor, with an LDL of 61 on her last lab work in July 2024.           Studies Reviewed: Marland Kitchen   EKG Interpretation Date/Time:  Monday July 08 2023 16:05:46 EST Ventricular Rate:  87 PR Interval:  130 QRS Duration:  82 QT Interval:  376 QTC Calculation: 452 R Axis:   82  Text  Interpretation: Normal sinus rhythm Normal ECG No previous ECGs available Confirmed by Donato Schultz ([id number]) on 07/08/2023 4:22:20 PM     Risk Assessment/Calculations:            Physical Exam:   VS:  BP 118/64   Pulse 87   Ht 5\' 6"  (1.676 m)   Wt 164 lb (74.4 kg)   LMP 07/17/2011   SpO2 96%   BMI 26.47 kg/m    Wt Readings from Last 3 Encounters:  07/08/23 164 lb (74.4 kg)  03/14/22 162 lb 6.4 oz (73.7 kg)  02/14/21 155 lb (70.3 kg)    GEN: Well nourished, well developed in no acute distress NECK: No JVD; No carotid bruits CARDIAC: RRR, no murmurs, no rubs, no gallops RESPIRATORY:  Clear to auscultation without rales, wheezing or rhonchi  ABDOMEN: Soft, non-tender, non-distended EXTREMITIES:  No edema; No deformity   ASSESSMENT AND PLAN: .    Assessment and Plan    Coronary Artery Disease Prior coronary calcium score of 217 (86th percentile) in 2022. Patient is adherent to Crestor 10mg  daily. LDL is well controlled at 61. -Continue Crestor 10mg  daily. -Continue Aspirin 81mg  daily.  Hypertension Well controlled on HCTZ and Losartan. -Continue current regimen.  General Health Maintenance Hemoglobin A1c of 6.0 and ALT of 15 as of July 2024. -Plan for annual follow-up.              Signed, Donato Schultz, MD

## 2023-07-08 NOTE — Patient Instructions (Signed)

## 2023-07-30 ENCOUNTER — Other Ambulatory Visit: Payer: Self-pay | Admitting: Family Medicine

## 2023-07-30 DIAGNOSIS — Z1231 Encounter for screening mammogram for malignant neoplasm of breast: Secondary | ICD-10-CM

## 2023-09-14 IMAGING — MG MM DIGITAL SCREENING BILAT W/ TOMO AND CAD
8 series · 8 of 24 positions shown · non-contrast
Comparison: Previous exam(s).

CLINICAL DATA: Screening.

EXAM:
DIGITAL SCREENING BILATERAL MAMMOGRAM WITH TOMOSYNTHESIS AND CAD
TECHNIQUE: Bilateral screening digital craniocaudal and mediolateral oblique
mammograms were obtained. Bilateral screening digital breast
tomosynthesis was performed. The images were evaluated with
computer-aided detection.

[R MLO synth-2D]
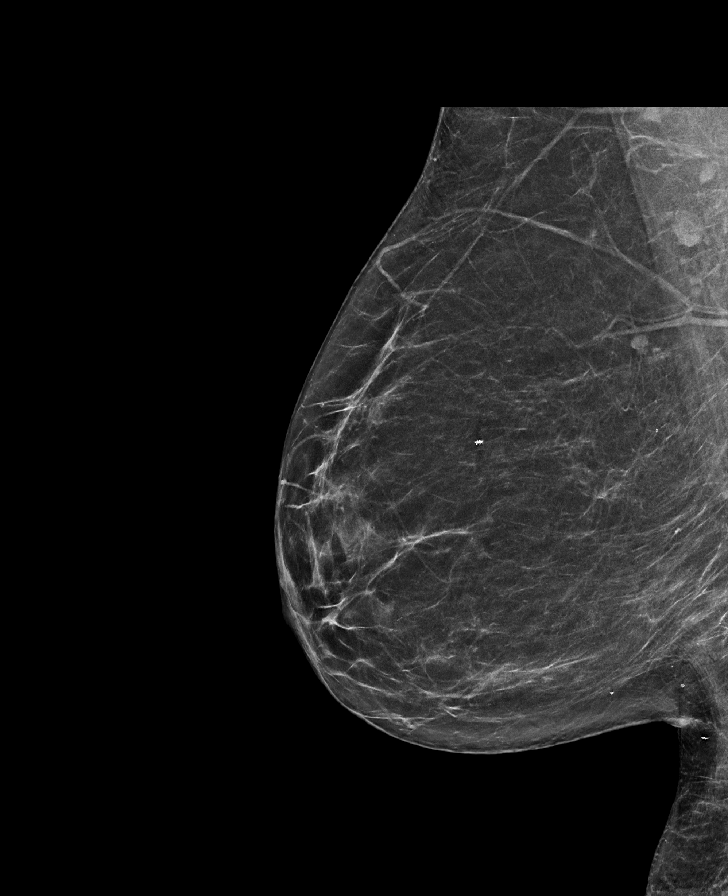

[L CC synth-2D]
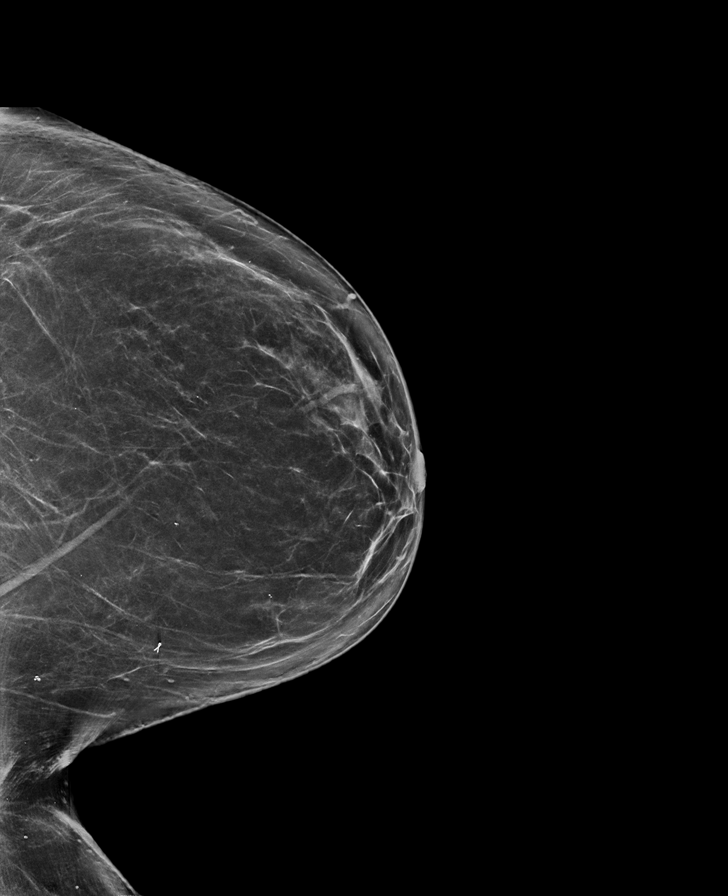

[L MLO synth-2D]
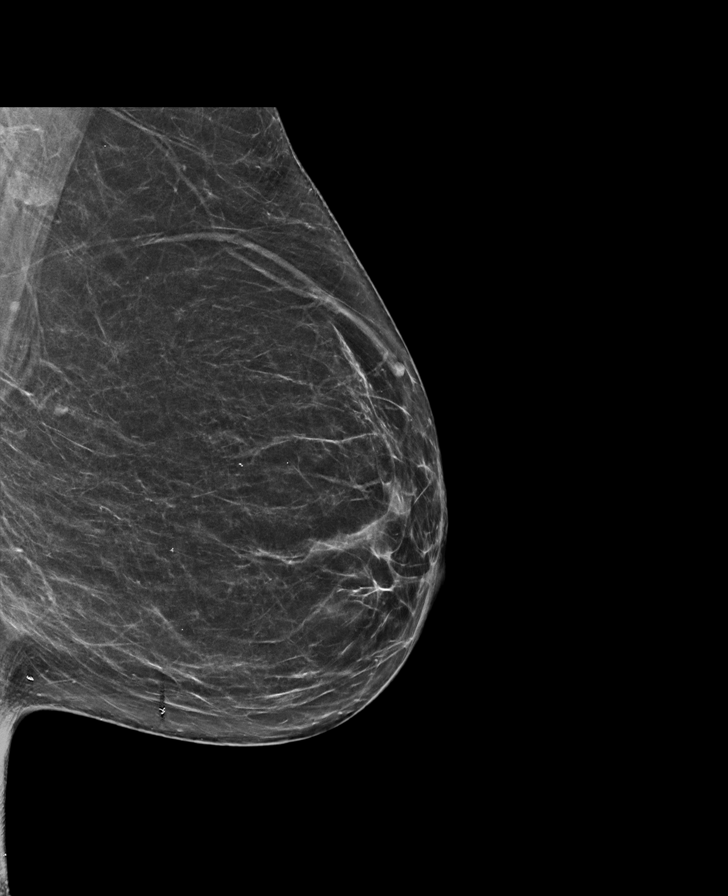

[R CC synth-2D]
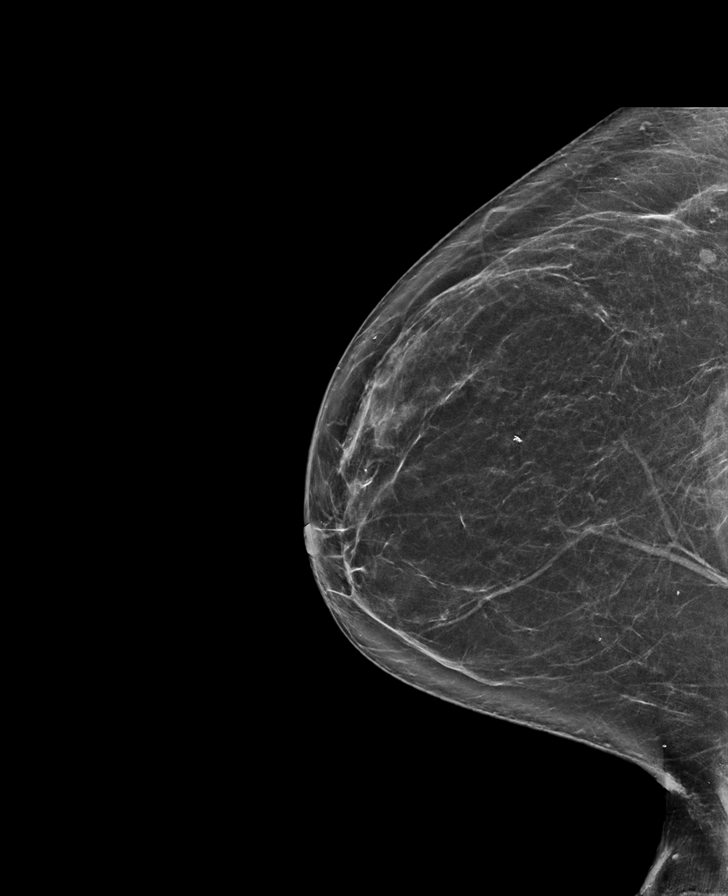

[L MLO tomo · tomo slice 37/72.0]
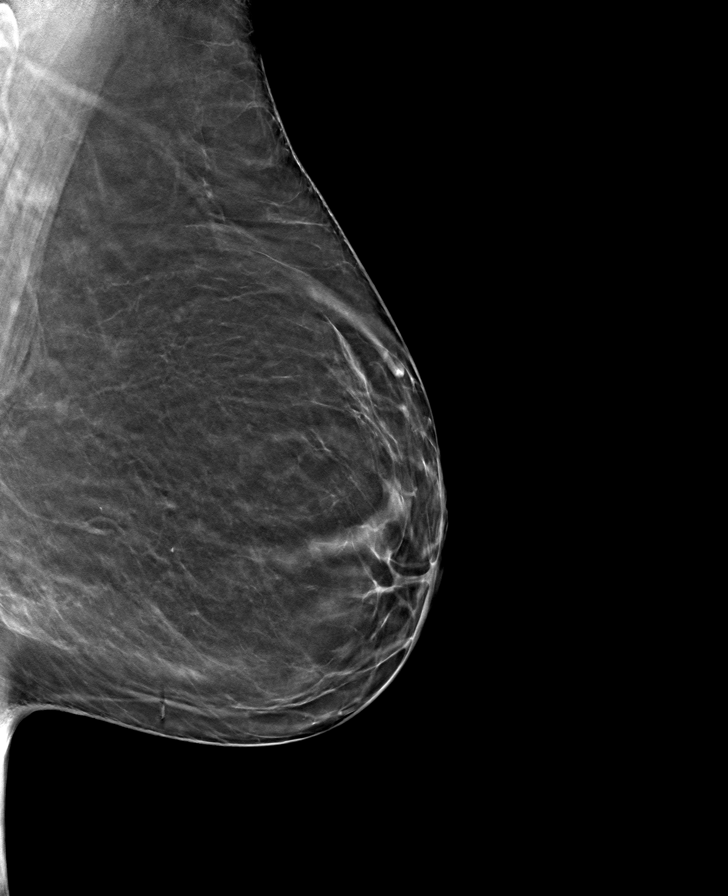

[R MLO tomo · tomo slice 35/69.0]
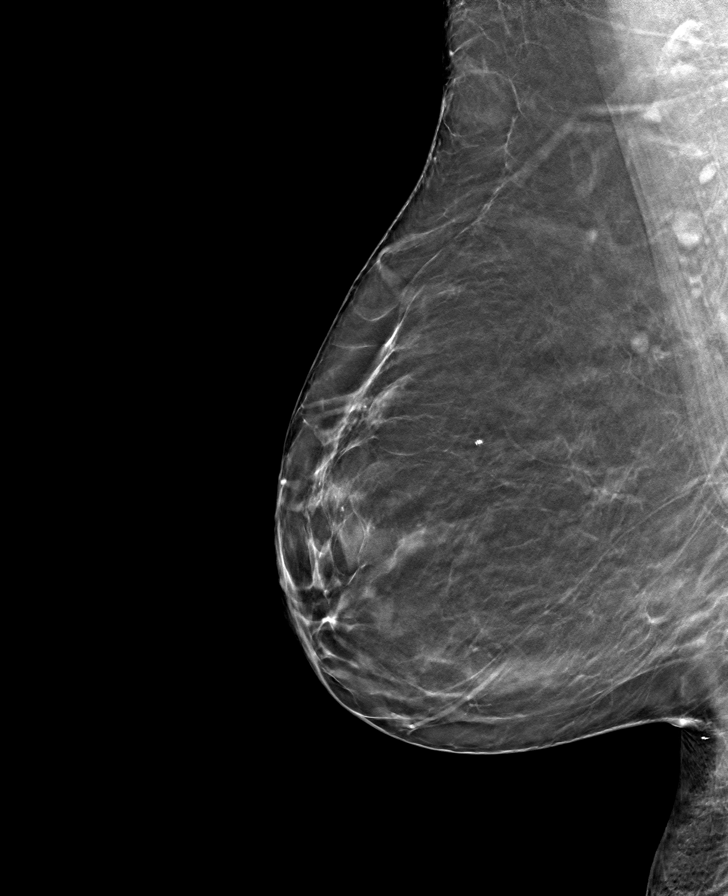

[R CC tomo · tomo slice 37/72.0]
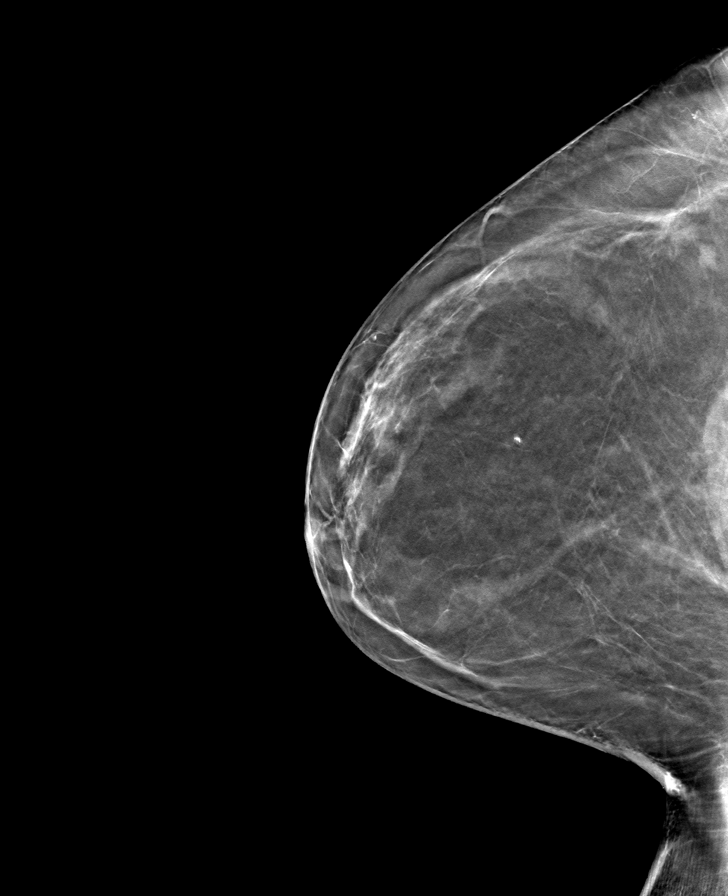

[L CC tomo · tomo slice 37/74.0]
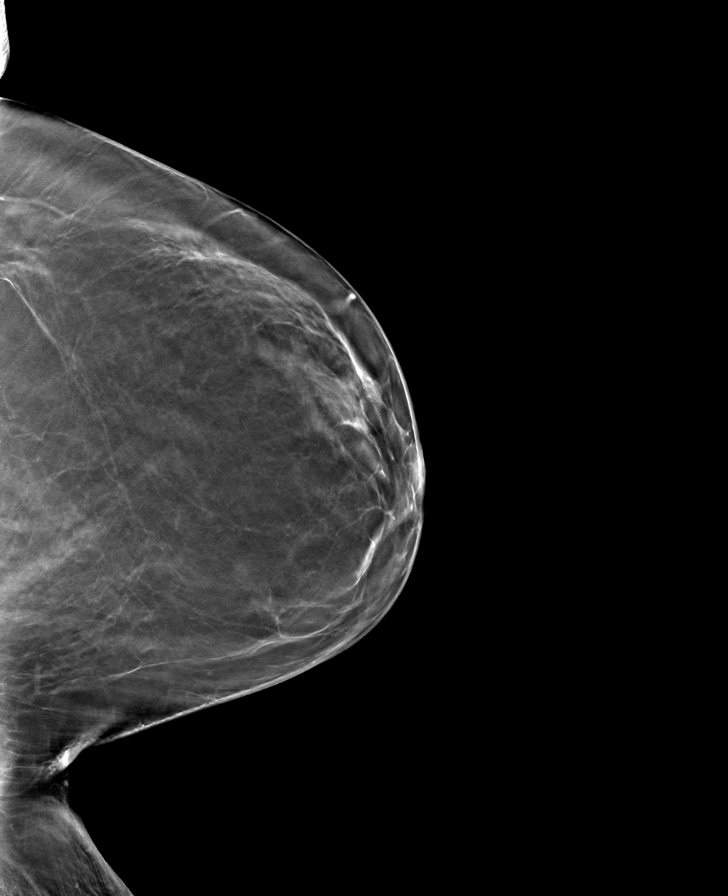

[8 of 24 positions shown; findings below may reference images not displayed]

ACR Breast Density Category b: There are scattered areas of
fibroglandular density.
FINDINGS: There are no findings suspicious for malignancy.
IMPRESSION: No mammographic evidence of malignancy. A result letter of this
screening mammogram will be mailed directly to the patient.

RECOMMENDATION:
Screening mammogram in one year. (Code:51-O-LD2)

BI-RADS CATEGORY  1: Negative.

## 2023-09-17 ENCOUNTER — Ambulatory Visit
Admission: RE | Admit: 2023-09-17 | Discharge: 2023-09-17 | Disposition: A | Discharge status: Home / Self Care | Payer: Medicare Other | Source: Ambulatory Visit | Attending: Family Medicine | Admitting: Family Medicine

## 2023-09-17 DIAGNOSIS — Z1231 Encounter for screening mammogram for malignant neoplasm of breast: Secondary | ICD-10-CM

## 2024-02-15 IMAGING — CT CT ABD-PELV W/ CM
1 of 3 series · 12 of 32 positions shown, 16 images · IV contrast (agent unspecified)
Comparison: Abdomen radiograph, 01/18/2022.
COMPARISON: Abdomen radiograph, 01/18/2022.

Addendum:
CLINICAL DATA: Concerned about thickened colon wall based on
abdominal radiograph dated 01/18/2022. That exam was performed for
loose stools for 1 year.

EXAM:
CT ABDOMEN AND PELVIS WITH CONTRAST
TECHNIQUE: Multidetector CT imaging of the abdomen and pelvis was performed
using the standard protocol following bolus administration of
intravenous contrast.

[Series 2: a/p w/ 5mm · axial · 0.85mm/px · z∈[-485,-55]mm · 12 of 98 slices shown, 16 images]
[im 6/98  soft-tissue]
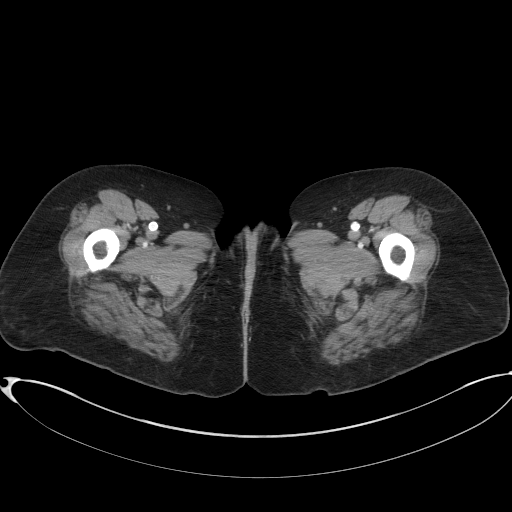
[im 6/98  bone]
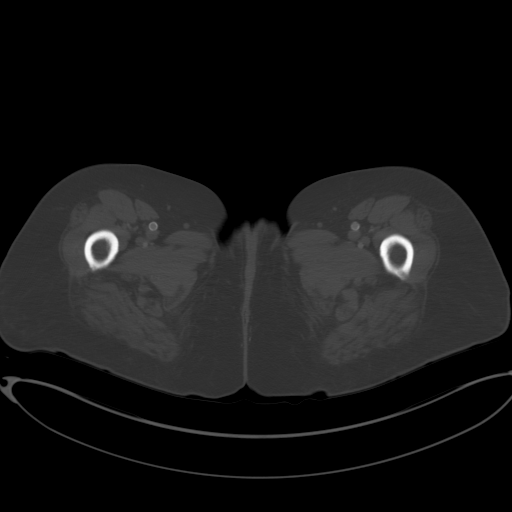
[im 16/98  soft-tissue]
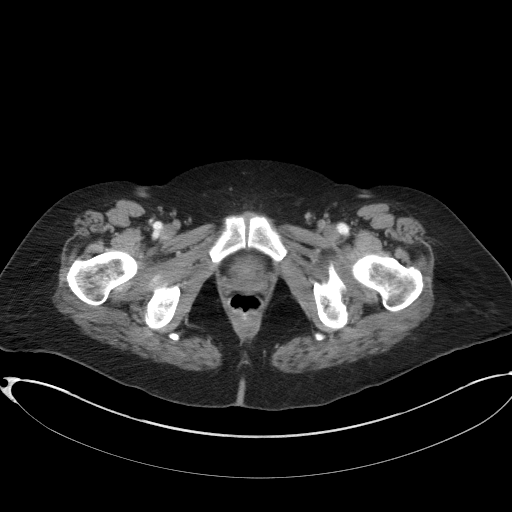
[im 26/98  soft-tissue]
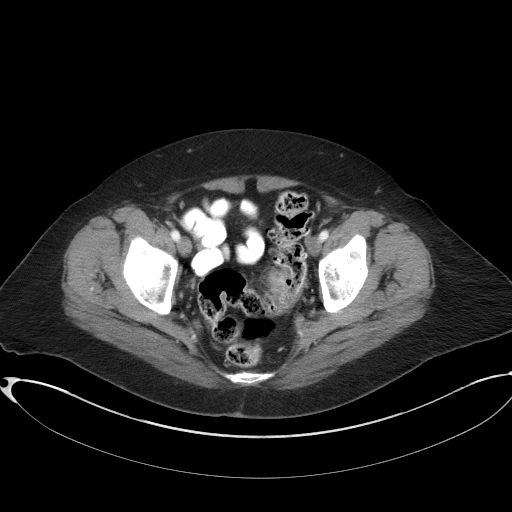
[im 36/98  soft-tissue]
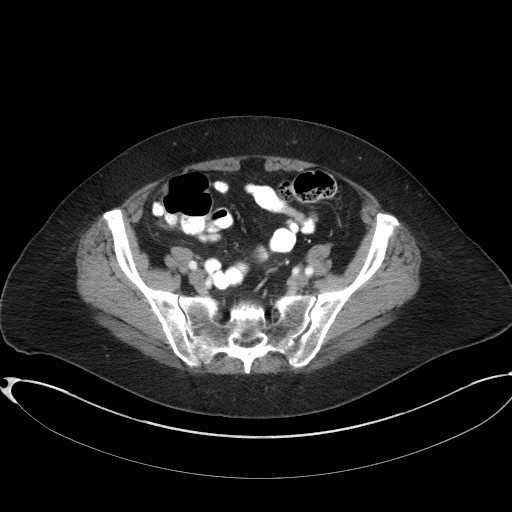
[im 46/98  soft-tissue]
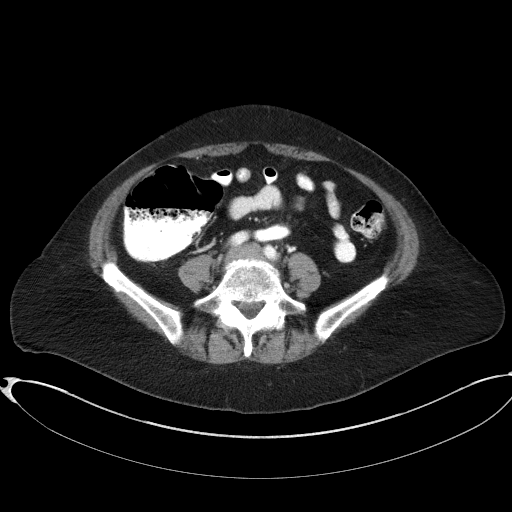
[im 52/98  soft-tissue]
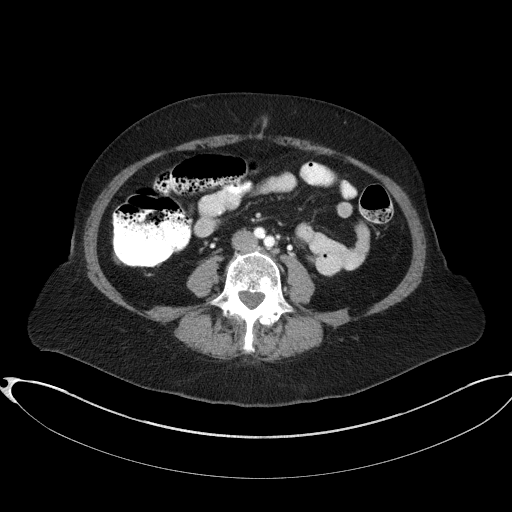
[im 62/98  soft-tissue]
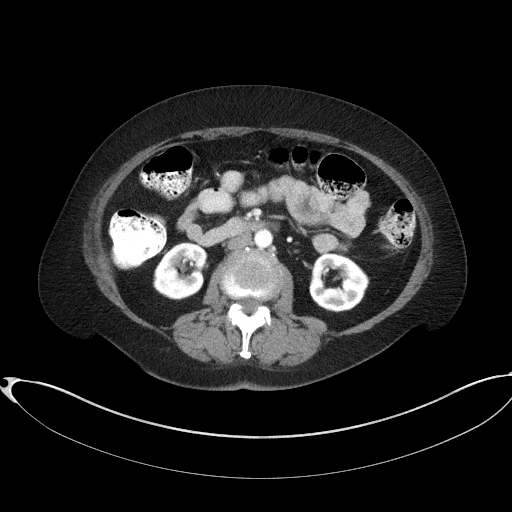
[im 72/98  soft-tissue]
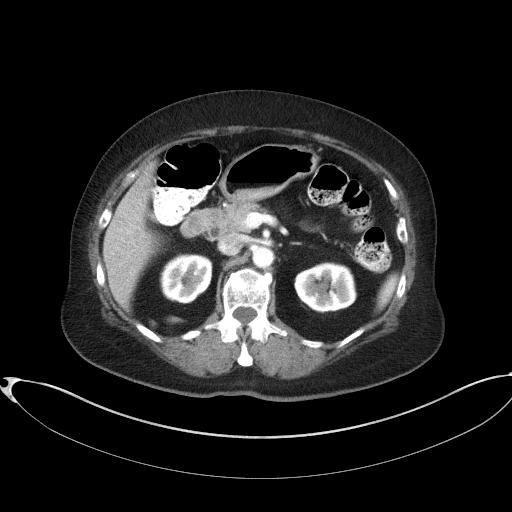
[im 77/98  lung]
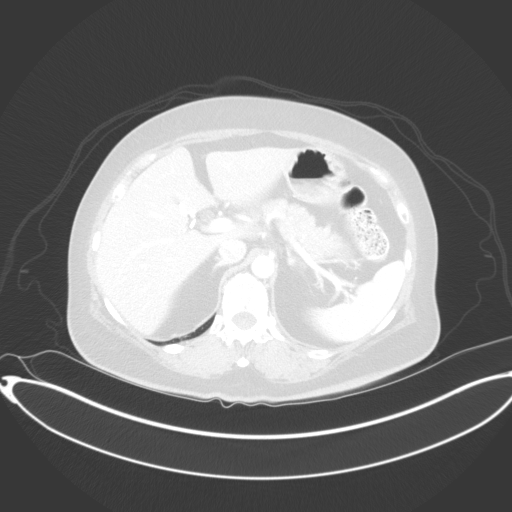
[im 82/98  soft-tissue]
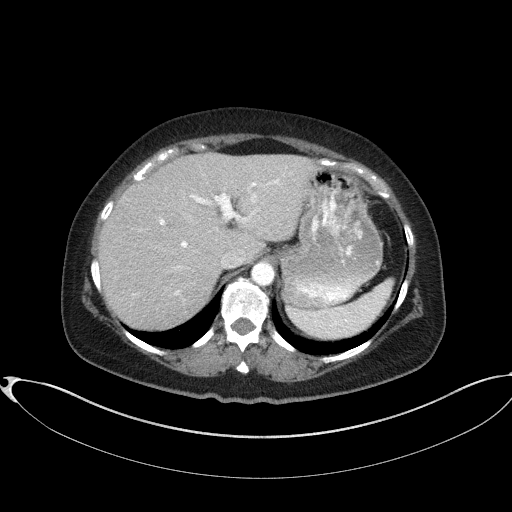
[im 82/98  lung]
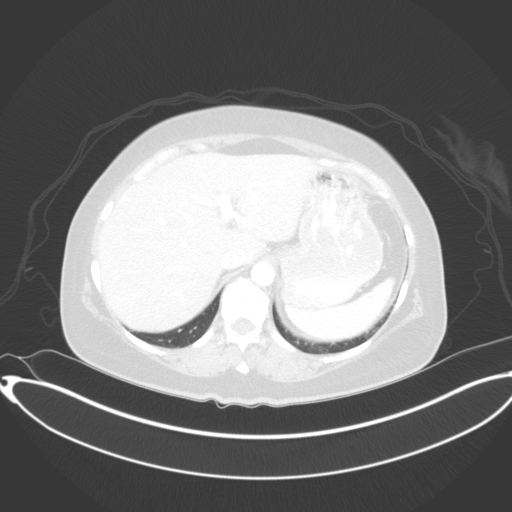
[im 82/98  bone]
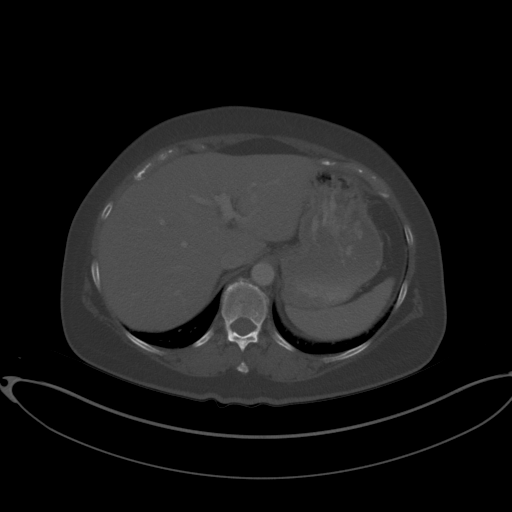
[im 87/98  lung]
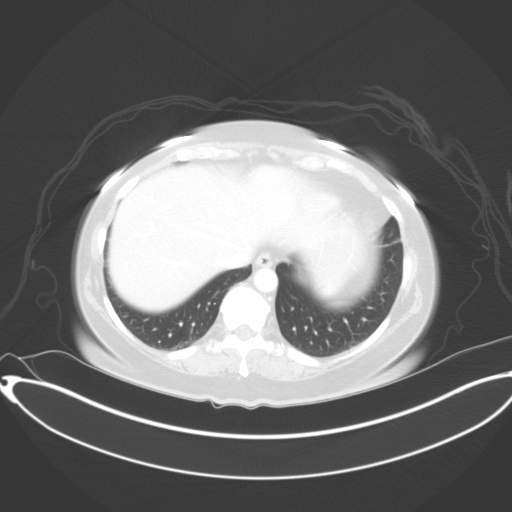
[im 92/98  soft-tissue]
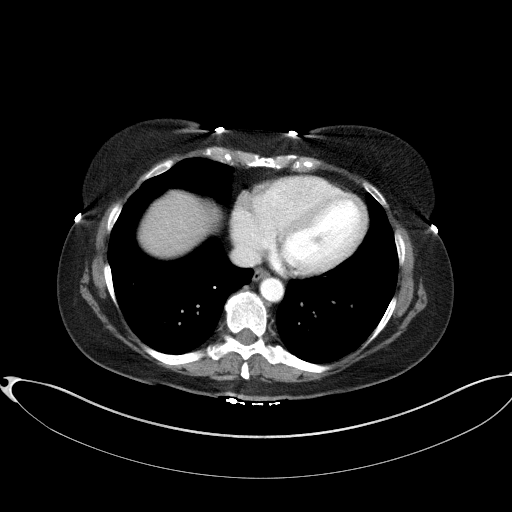
[im 92/98  lung]
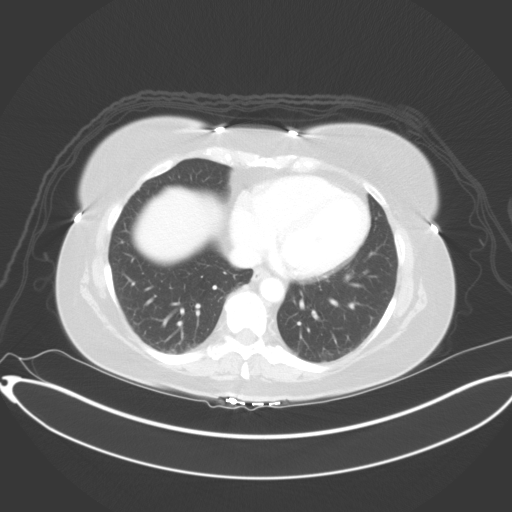

[12 of 32 positions shown; findings below may reference images not displayed]

RADIATION DOSE REDUCTION: This exam was performed according to the
departmental dose-optimization program which includes automated
exposure control, adjustment of the mA and/or kV according to
patient size and/or use of iterative reconstruction technique.

CONTRAST:  100mL 2OMLZM-BRR IOPAMIDOL (2OMLZM-BRR) INJECTION 61%
FINDINGS: Lower chest: Several small nodules, largest 4 mm, left lower lobe,
image 2, series 4. No acute findings.

Hepatobiliary: No focal liver abnormality is seen. Status post
cholecystectomy. No biliary dilatation.

Pancreas: Unremarkable. No pancreatic ductal dilatation or
surrounding inflammatory changes.

Spleen: Normal in size without focal abnormality.

Adrenals/Urinary Tract: No adrenal masses. Kidneys normal in size,
orientation and position. 9 mm upper pole right renal cyst. No other
defined masses, no stones and no hydronephrosis. Normal ureters.
Normal bladder.

Stomach/Bowel: Mild hazy inflammatory change adjacent to the mid to
distal sigmoid colon, centered on a diverticulum, associated with
mild eccentric wall thickening. There is no extraluminal or free
air. No fluid collection is seen to indicate abscess.

No additional colon wall thickening or inflammation. Mild distension
of the right colon with generalized increased colonic stool burden.

Stomach and small bowel are unremarkable.  Normal appendix.

Vascular/Lymphatic: Minor aortic atherosclerosis. No aneurysm no
enlarged lymph nodes.

Reproductive: Uterus and bilateral adnexa are unremarkable.

Other: No abdominal wall hernia or abnormality. No abdominopelvic
ascites.

Musculoskeletal: No fracture or acute finding.  No bone lesion.
IMPRESSION: 1. Mild, uncomplicated, sigmoid diverticulitis. No extraluminal or
free air. No abscess.
2. No other acute abnormality within the abdomen or pelvis.

ADDENDUM:
Small lung nodules, 4 mm and less. No follow-up needed if patient is
low-risk (and has no known or suspected primary neoplasm).
Non-contrast chest CT can be considered in 12 months if patient is
high-risk. This recommendation follows the consensus statement:
Guidelines for Management of Incidental Pulmonary Nodules Detected
[DATE].

*** End of Addendum ***
RADIATION DOSE REDUCTION: This exam was performed according to the
departmental dose-optimization program which includes automated
exposure control, adjustment of the mA and/or kV according to
patient size and/or use of iterative reconstruction technique.

CONTRAST:  100mL 2OMLZM-BRR IOPAMIDOL (2OMLZM-BRR) INJECTION 61%
FINDINGS: Lower chest: Several small nodules, largest 4 mm, left lower lobe,
image 2, series 4. No acute findings.

Hepatobiliary: No focal liver abnormality is seen. Status post
cholecystectomy. No biliary dilatation.

Pancreas: Unremarkable. No pancreatic ductal dilatation or
surrounding inflammatory changes.

Spleen: Normal in size without focal abnormality.

Adrenals/Urinary Tract: No adrenal masses. Kidneys normal in size,
orientation and position. 9 mm upper pole right renal cyst. No other
defined masses, no stones and no hydronephrosis. Normal ureters.
Normal bladder.

Stomach/Bowel: Mild hazy inflammatory change adjacent to the mid to
distal sigmoid colon, centered on a diverticulum, associated with
mild eccentric wall thickening. There is no extraluminal or free
air. No fluid collection is seen to indicate abscess.

No additional colon wall thickening or inflammation. Mild distension
of the right colon with generalized increased colonic stool burden.

Stomach and small bowel are unremarkable.  Normal appendix.

Vascular/Lymphatic: Minor aortic atherosclerosis. No aneurysm no
enlarged lymph nodes.

Reproductive: Uterus and bilateral adnexa are unremarkable.

Other: No abdominal wall hernia or abnormality. No abdominopelvic
ascites.

Musculoskeletal: No fracture or acute finding.  No bone lesion.
IMPRESSION: 1. Mild, uncomplicated, sigmoid diverticulitis. No extraluminal or
free air. No abscess.
2. No other acute abnormality within the abdomen or pelvis.

## 2024-07-07 ENCOUNTER — Encounter: Payer: Self-pay | Admitting: Cardiology

## 2024-07-07 ENCOUNTER — Ambulatory Visit: Attending: Cardiology | Admitting: Cardiology

## 2024-07-07 VITALS — BP 130/83 | HR 72 | Ht 66.0 in | Wt 165.8 lb

## 2024-07-07 DIAGNOSIS — I10 Essential (primary) hypertension: Secondary | ICD-10-CM | POA: Diagnosis not present

## 2024-07-07 DIAGNOSIS — I2510 Atherosclerotic heart disease of native coronary artery without angina pectoris: Secondary | ICD-10-CM | POA: Diagnosis not present

## 2024-07-07 DIAGNOSIS — E7800 Pure hypercholesterolemia, unspecified: Secondary | ICD-10-CM

## 2024-07-07 NOTE — Patient Instructions (Signed)

## 2024-07-07 NOTE — Progress Notes (Signed)
 Cardiology Office Note:  .   Date:  07/07/2024  ID:  Frances Crosby, DOB 1951-02-21, MRN [id number] PCP: Chrystal Lamarr RAMAN, MD  [facility] Providers Cardiologist:  Oneil Parchment, MD     History of Present Illness: .   Frances Crosby is a 73 y.o. female Discussed the use of AI scribe  History of Present Illness Frances Crosby is a 73 year old female with coronary artery disease who presents for a follow-up visit.  She has a history of coronary artery disease with a calcium score of 217, placing her in the 86th percentile as of May 2022. She is currently on Crestor 10 mg daily for hyperlipidemia and follows a Mediterranean diet. Her LDL is 59, A1c is 6.1, hemoglobin is 13.5, and potassium is 4.4. The patient reports that she is not currently having any pain. She continues to take aspirin  81 mg daily.  She experiences occasional episodes of sudden energy loss, but there is no further elaboration on these episodes.  She has a past history of de Quervain's tendinitis, which was treated with a corticosteroid injection, providing significant relief. She also wore a brace during this time. The tendinitis was attributed to repetitive motion, specifically from getting into her significant other's truck. 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  She enjoys traveling in her RV, having recently visited the [place], [place], [place] , [place], [place], and [place]. 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      Studies Reviewed: SABRA   EKG Interpretation Date/Time:  Tuesday July 07 2024 09:39:34 EST Ventricular Rate:  64 PR Interval:  134 QRS Duration:  68 QT Interval:  394 QTC Calculation: 406 R Axis:   28  Text Interpretation: Normal sinus rhythm Cannot rule out Anterior infarct , age undetermined When compared with ECG of 08-Jul-2023 16:05,  No significant change was found Confirmed by Parchment Oneil ([id number]) on 07/07/2024 9:52:38 AM    Results LABS LDL: 59 A1c: 6.1 Hemoglobin: 13.5 Potassium: 4.4  RADIOLOGY Calcium score: 217, 86th percentile (01/2021)  DIAGNOSTIC EKG: Normal Risk Assessment/Calculations:            Physical Exam:   VS:  BP 130/83   Pulse 72   Ht 5' 6 (1.676 m)   Wt 165 lb 12.8 oz (75.2 kg)   LMP 07/17/2011   SpO2 98%   BMI 26.76 kg/m    Wt Readings from Last 3 Encounters:  07/07/24 165 lb 12.8 oz (75.2 kg)  07/08/23 164 lb (74.4 kg)  03/14/22 162 lb 6.4 oz (73.7 kg)    GEN: Well nourished, well developed in no acute distress NECK: No JVD; No carotid bruits CARDIAC: RRR, no murmurs, no rubs, no gallops RESPIRATORY:  Clear to auscultation without rales, wheezing or rhonchi  ABDOMEN: Soft, non-tender, non-distended EXTREMITIES:  No edema; No deformity   ASSESSMENT AND PLAN: .    Assessment and Plan Assessment & Plan Atherosclerotic heart disease of native coronary artery without angina pectoris Coronary artery disease with a calcium score of 217 (86th percentile) as of May 2022. Currently asymptomatic with no chest pain or exertional discomfort. EKG is normal. - Continue Crestor 10 mg daily. - Continue Aspirin  81 mg daily. - Monitor for any new symptoms such as chest pain or exertional discomfort. - Will schedule follow-up in one year.  Essential hypertension  Blood pressure is well-controlled at 130/83 mmHg on current medication regimen. - Continue Losartan 50 mg daily. - Continue Hydrochlorothiazide 12.5 mg daily.  Hyperlipidemia LDL cholesterol is well-controlled at 59 mg/dL on current treatment regimen. - Continue Crestor 10 mg daily. - Maintain Mediterranean diet.         Dispo: 1 yr  Signed, Oneil Parchment, MD

## 2024-08-10 ENCOUNTER — Other Ambulatory Visit: Payer: Self-pay | Admitting: Family Medicine

## 2024-08-10 DIAGNOSIS — Z1231 Encounter for screening mammogram for malignant neoplasm of breast: Secondary | ICD-10-CM

## 2024-09-22 ENCOUNTER — Ambulatory Visit
Admission: RE | Admit: 2024-09-22 | Discharge: 2024-09-22 | Disposition: A | Source: Ambulatory Visit | Attending: Family Medicine

## 2024-09-22 DIAGNOSIS — Z1231 Encounter for screening mammogram for malignant neoplasm of breast: Secondary | ICD-10-CM
